# Patient Record
Sex: Male | Born: 1990 | Race: Black or African American | Hispanic: No | Marital: Single | Smoking: Never smoker
Health system: Southern US, Community
[De-identification: ages and names within clinical notes are randomized; demographics above are authoritative.]

---
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
## 2011-04-19 ENCOUNTER — Telehealth: Payer: Self-pay

## 2011-04-19 NOTE — Telephone Encounter (Signed)
Patient requesting that copies of his medical records be mailed to him.

## 2016-04-21 ENCOUNTER — Emergency Department (HOSPITAL_COMMUNITY)
Admission: EM | Admit: 2016-04-21 | Discharge: 2016-04-21 | Disposition: A | Discharge status: Home / Self Care | Payer: Self-pay | Attending: Emergency Medicine | Admitting: Emergency Medicine

## 2016-04-21 ENCOUNTER — Encounter (HOSPITAL_COMMUNITY): Payer: Self-pay | Admitting: Emergency Medicine

## 2016-04-21 DIAGNOSIS — B09 Unspecified viral infection characterized by skin and mucous membrane lesions: Secondary | ICD-10-CM | POA: Insufficient documentation

## 2016-04-21 DIAGNOSIS — R21 Rash and other nonspecific skin eruption: Secondary | ICD-10-CM

## 2016-04-21 LAB — I-STAT CHEM 8, ED
BUN: 13 mg/dL (ref 6–20)
Calcium, Ion: 1.11 mmol/L — ABNORMAL LOW (ref 1.15–1.40)
Chloride: 100 mmol/L — ABNORMAL LOW (ref 101–111)
Creatinine, Ser: 1.1 mg/dL (ref 0.61–1.24)
Glucose, Bld: 94 mg/dL (ref 65–99)
HCT: 39 % (ref 39.0–52.0)
Hemoglobin: 13.3 g/dL (ref 13.0–17.0)
Potassium: 3.6 mmol/L (ref 3.5–5.1)
Sodium: 139 mmol/L (ref 135–145)
TCO2: 24 mmol/L (ref 0–100)

## 2016-04-21 MED ORDER — ACETAMINOPHEN 160 MG/5ML PO SOLN: 15.0000 mg/kg | Freq: Once | ORAL | Status: DC

## 2016-04-21 MED ORDER — PREDNISONE 20 MG PO TABS: 60.0000 mg | ORAL_TABLET | Freq: Once | ORAL | Status: DC

## 2016-04-21 MED ORDER — IBUPROFEN 200 MG PO TABS
600.0000 mg | ORAL_TABLET | Freq: Once | ORAL | Status: AC
  Administered 2016-04-21: 600 mg via ORAL
  Filled 2016-04-21: qty 3

## 2016-04-21 MED ORDER — ACETAMINOPHEN 500 MG PO TABS
1000.0000 mg | ORAL_TABLET | Freq: Once | ORAL | Status: AC
  Administered 2016-04-21: 1000 mg via ORAL
  Filled 2016-04-21: qty 2

## 2016-04-21 MED ORDER — FAMOTIDINE 20 MG PO TABS: 20.0000 mg | ORAL_TABLET | Freq: Two times a day (BID) | ORAL | 0 refills | Status: AC

## 2016-04-21 MED ORDER — PREDNISONE 50 MG PO TABS: 50.0000 mg | ORAL_TABLET | Freq: Every day | ORAL | 0 refills | Status: AC

## 2016-04-21 NOTE — ED Notes (Signed)
Patient state he eat donut in [place] 2 days a go and noticed some swelling and rash to his feet. Feets are warm to touch with some swelling and rash upon arrival.

## 2016-04-21 NOTE — ED Triage Notes (Signed)
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

## 2016-04-21 NOTE — ED Notes (Signed)
PA alerted to temp 101.8.

## 2016-04-21 NOTE — ED Notes (Signed)
Received bedside report from Madina RN 

## 2016-04-21 NOTE — ED Provider Notes (Signed)
[facility]-EMERGENCY DEPT Provider Note   CSN: [id number] Arrival date & time: 04/21/16  1749   By signing my name below, I, Henry Johnson, attest that this documentation has been prepared under the direction and in the presence of Ebbie Ridgehris Livi Mcgann, PA-C Electronically Signed: Soijett Johnson, ED Scribe. 04/21/16. 7:57 PM.  History   Chief Complaint Chief Complaint  Patient presents with  . Allergic Reaction    HPI Henry Johnson is a 26 y.o. male who presents to the Emergency Department complaining of allergic reaction onset 2 days ago. Pt reports associated non-pruritic rash to bilateral hands, non-pruritic rash to bilateral feet, and irritation to lips. Pt hasn't tried any medications for the relief of his symptoms. 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 He denies fever, chills, and any other symptoms. Denies pertinent PMHx.    The history is provided by the patient. No language interpreter was used.    History reviewed. No pertinent past medical history.  There are no active problems to display for this patient.   History reviewed. No pertinent surgical history.     Home Medications    Prior to Admission medications   Not on File    Family History No family history on file.  Social History Social History  Substance Use Topics  . Smoking status: Not on file  . Smokeless tobacco: Not on file  . Alcohol use No     Allergies   Shrimp [shellfish allergy]   Review of Systems Review of Systems A complete 10 system review of systems was obtained and all systems are negative except as noted in the HPI and PMH.   Physical Exam Updated Vital Signs BP 129/76   Pulse 120   Temp 98.1 F (36.7 C)   Resp 16   SpO2 98%   Physical Exam  Constitutional: He is oriented to person, place, and time. He appears well-developed and well-nourished. No distress.  HENT:  Head: Normocephalic and atraumatic.  Mouth/Throat: Uvula is midline,  oropharynx is clear and moist and mucous membranes are normal.  Lips appear irritated  Eyes: EOM are normal.  Neck: Neck supple.  Cardiovascular: Normal rate.   Pulmonary/Chest: Effort normal. No respiratory distress.  Abdominal: He exhibits no distension.  Musculoskeletal: Normal range of motion.  Neurological: He is alert and oriented to person, place, and time.  Skin: Skin is warm and dry. Rash noted. There is erythema.  Fine red rash to top and bottom of bilateral feet and tops of hands. Mild redness noted to bilateral palms.  Psychiatric: He has a normal mood and affect. His behavior is normal.  Nursing note and vitals reviewed.    ED Treatments / Results  DIAGNOSTIC STUDIES: Oxygen Saturation is 98% on RA, nl by my interpretation.    COORDINATION OF CARE: 7:28 PM Discussed treatment plan with pt at bedside which includes labs, pepcid, prednisone, and pt agreed to plan.   Labs (all labs ordered are listed, but only abnormal results are displayed) Labs Reviewed  I-STAT CHEM 8, ED - Abnormal; Notable for the following:       Result Value   Chloride 100 (*)    Calcium, Ion 1.11 (*)    All other components within normal limits  RPR    Procedures Procedures (including critical care time)  Medications Ordered in ED Medications - No data to display   Initial Impression / Assessment and Plan / ED Course  I have reviewed the triage vital  signs and the nursing notes.  Pertinent labs that were available during my care of the patient were reviewed by me and considered in my medical decision making (see chart for details).     She most likely has a viral exanthem.  Due to the fact that he has fever and this rash.  I did check an RPR as well.  Patient is advised plan and all questions were answered.  Given treatment for the rash and fever.  He has no other symptoms at this time is advised to return here as needed  Final Clinical Impressions(s) / ED Diagnoses   Final  diagnoses:  None    New Prescriptions New Prescriptions   No medications on file   I personally performed the services described in this documentation, which was scribed in my presence. The recorded information has been reviewed and is accurate.    Jhoel Stieg, PA-C 04/23/16 0111    Mancel Bale, MD 04/23/16 [phone]

## 2016-04-21 NOTE — ED Provider Notes (Signed)
  Face-to-face evaluation   History: He complains of a rash for 2 days, and his lips hands and feet.  He complains of general malaise.  He is interested in finding out what caused the rash.  Physical exam: Alert, cooperative.  No respiratory distress.  Tiny red papules, lips dorsum of hands and dorsum of feet, bilaterally.  No angioedema.  MDM-rash pattern with fever most consistent with viral exanthem.  Doubt serious bacterial associated rash.  Doubt tick fever.  Doubt urticaria.  Medical screening examination/treatment/procedure(s) were conducted as a shared visit with non-physician practitioner(s) and myself.  I personally evaluated the patient during the encounter   Mancel BaleElliott Ovide Dusek, MD 04/23/16 [phone]

## 2016-04-21 NOTE — Discharge Instructions (Addendum)
Return here as needed.  Follow-up with an urgent care or primary doctor.  Tylenol and Motrin.  Take Benadryl as well for this rash

## 2016-04-22 LAB — RPR: RPR Ser Ql: NONREACTIVE

## 2019-09-14 ENCOUNTER — Emergency Department (HOSPITAL_COMMUNITY): Payer: Self-pay

## 2019-09-14 ENCOUNTER — Encounter (HOSPITAL_COMMUNITY): Payer: Self-pay

## 2019-09-14 ENCOUNTER — Emergency Department (HOSPITAL_COMMUNITY)
Admission: EM | Admit: 2019-09-14 | Discharge: 2019-09-14 | Disposition: A | Discharge status: Home / Self Care | Payer: Self-pay | Attending: Emergency Medicine | Admitting: Emergency Medicine

## 2019-09-14 ENCOUNTER — Other Ambulatory Visit: Payer: Self-pay

## 2019-09-14 DIAGNOSIS — W260XXA Contact with knife, initial encounter: Secondary | ICD-10-CM | POA: Insufficient documentation

## 2019-09-14 DIAGNOSIS — Y999 Unspecified external cause status: Secondary | ICD-10-CM | POA: Insufficient documentation

## 2019-09-14 DIAGNOSIS — S41112A Laceration without foreign body of left upper arm, initial encounter: Secondary | ICD-10-CM

## 2019-09-14 DIAGNOSIS — Y929 Unspecified place or not applicable: Secondary | ICD-10-CM | POA: Insufficient documentation

## 2019-09-14 DIAGNOSIS — Z23 Encounter for immunization: Secondary | ICD-10-CM | POA: Insufficient documentation

## 2019-09-14 DIAGNOSIS — W540XXA Bitten by dog, initial encounter: Secondary | ICD-10-CM

## 2019-09-14 DIAGNOSIS — S51812A Laceration without foreign body of left forearm, initial encounter: Secondary | ICD-10-CM | POA: Insufficient documentation

## 2019-09-14 DIAGNOSIS — Y939 Activity, unspecified: Secondary | ICD-10-CM | POA: Insufficient documentation

## 2019-09-14 DIAGNOSIS — Z79899 Other long term (current) drug therapy: Secondary | ICD-10-CM | POA: Insufficient documentation

## 2019-09-14 MED ORDER — LIDOCAINE HCL (PF) 1 % IJ SOLN
30.0000 mL | Freq: Once | INTRAMUSCULAR | Status: AC
  Administered 2019-09-14: 22:00:00 30 mL
  Filled 2019-09-14: qty 30

## 2019-09-14 MED ORDER — AMOXICILLIN-POT CLAVULANATE 875-125 MG PO TABS: 1.0000 | ORAL_TABLET | Freq: Two times a day (BID) | ORAL | 0 refills | Status: AC

## 2019-09-14 MED ORDER — TETANUS-DIPHTH-ACELL PERTUSSIS 5-2.5-18.5 LF-MCG/0.5 IM SUSP
0.5000 mL | Freq: Once | INTRAMUSCULAR | Status: AC
  Administered 2019-09-14: 22:00:00 0.5 mL via INTRAMUSCULAR
  Filled 2019-09-14: qty 0.5

## 2019-09-14 NOTE — ED Triage Notes (Signed)
Laceration to left forearm approx 4" from box cutter.

## 2019-09-14 NOTE — Discharge Instructions (Addendum)
Follow-up in 7 to 10 days for suture removal at any provider.

## 2019-09-14 NOTE — ED Provider Notes (Signed)
..Laceration Repair  Date/Time: 09/14/2019 10:27 PM Performed by: Gailen Shelter, PA Authorized by: Gailen Shelter, PA   Consent:    Consent obtained:  Verbal   Consent given by:  Patient   Risks discussed:  Infection, need for additional repair, pain, poor cosmetic result and poor wound healing   Alternatives discussed:  No treatment and delayed treatment Universal protocol:    Procedure explained and questions answered to patient or proxy's satisfaction: yes     Relevant documents present and verified: yes     Test results available and properly labeled: yes     Imaging studies available: yes     Required blood products, implants, devices, and special equipment available: yes     Site/side marked: yes     Immediately prior to procedure, a time out was called: yes     Patient identity confirmed:  Verbally with patient Anesthesia (see MAR for exact dosages):    Anesthesia method:  Local infiltration   Local anesthetic:  Lidocaine 1% w/o epi Laceration details:    Location:  Shoulder/arm   Shoulder/arm location:  L lower arm   Length (cm):  10 Repair type:    Repair type:  Intermediate Exploration:    Hemostasis achieved with:  Direct pressure   Wound extent: no foreign bodies/material noted and no tendon damage noted     Contaminated: no   Treatment:    Area cleansed with:  Saline   Amount of cleaning:  Standard   Irrigation solution:  Sterile saline   Irrigation volume:  300 mL   Irrigation method:  Pressure wash   Visualized foreign bodies/material removed: no   Skin repair:    Repair method:  Sutures   Suture size:  3-0   Suture technique:  Simple interrupted and horizontal mattress (6 simple, 1 horizontal)   Number of sutures:  7 Approximation:    Approximation:  Close Post-procedure details:    Dressing:  Antibiotic ointment and non-adherent dressing   Patient tolerance of procedure:  Tolerated well, no immediate complications .Marland KitchenLaceration  Repair  Date/Time: 09/14/2019 10:29 PM Performed by: Gailen Shelter, PA Authorized by: Gailen Shelter, PA   Consent:    Consent obtained:  Verbal   Consent given by:  Patient   Risks discussed:  Infection, need for additional repair, pain, poor cosmetic result and poor wound healing   Alternatives discussed:  No treatment and delayed treatment Universal protocol:    Procedure explained and questions answered to patient or proxy's satisfaction: yes     Relevant documents present and verified: yes     Test results available and properly labeled: yes     Imaging studies available: yes     Required blood products, implants, devices, and special equipment available: yes     Site/side marked: yes     Immediately prior to procedure, a time out was called: yes     Patient identity confirmed:  Verbally with patient Anesthesia (see MAR for exact dosages):    Anesthesia method:  Local infiltration   Local anesthetic:  Lidocaine 1% w/o epi Laceration details:    Location:  Shoulder/arm   Shoulder/arm location:  L lower arm   Length (cm):  0.5 Repair type:    Repair type:  Simple Exploration:    Hemostasis achieved with:  Direct pressure   Wound extent: no foreign bodies/material noted and no tendon damage noted     Contaminated: no   Treatment:    Area cleansed  with:  Saline   Amount of cleaning:  Standard   Irrigation solution:  Sterile saline   Irrigation method:  Pressure wash   Visualized foreign bodies/material removed: no   Skin repair:    Repair method:  Sutures   Suture size:  3-0   Suture technique:  Simple interrupted   Number of sutures:  1 Approximation:    Approximation:  Close Post-procedure details:    Dressing:  Antibiotic ointment and non-adherent dressing   Patient tolerance of procedure:  Tolerated well, no immediate complications   Two laceration repairs done by myself. Patient seen by Dr. Myrtis Ser primarily. Please see his note for full details.    Gailen Shelter, [place] 09/14/19 2231    Sabino Donovan, MD 09/14/19 2239

## 2019-09-14 NOTE — ED Provider Notes (Signed)
[facility]-EMERGENCY DEPT Provider Note   CSN: [id number] Arrival date & time: 09/14/19  1930     History Chief Complaint  Patient presents with  . Laceration    Henry Johnson is a 29 y.o. male.   Laceration Location:  Shoulder/arm Shoulder/arm laceration location:  L forearm Length:  10cm Depth:  Through dermis Quality: straight   Bleeding: venous   Injury mechanism: box cutter. Pain details:    Quality:  Aching Foreign body present:  No foreign bodies Worsened by:  Nothing Tetanus status:  Out of date Associated symptoms: no fever and no rash        History reviewed. No pertinent past medical history.  There are no problems to display for this patient.   History reviewed. No pertinent surgical history.     No family history on file.  Social History   Tobacco Use  . Smoking status: Never Smoker  . Smokeless tobacco: Never Used  Substance Use Topics  . Alcohol use: No  . Drug use: Not Currently    Home Medications Prior to Admission medications   Medication Sig Start Date End Date Taking? Authorizing Provider  amoxicillin-clavulanate (AUGMENTIN) 875-125 MG tablet Take 1 tablet by mouth 2 (two) times daily for 10 days. 09/14/19 09/24/19  Sabino Donovan, MD  famotidine (PEPCID) 20 MG tablet Take 1 tablet (20 mg total) by mouth 2 (two) times daily. 04/21/16   Lawyer, Cristal Deer, PA-C  predniSONE (DELTASONE) 50 MG tablet Take 1 tablet (50 mg total) by mouth daily. 04/21/16   Lawyer, Cristal Deer, PA-C    Allergies    Shrimp [shellfish allergy]  Review of Systems   Review of Systems  Constitutional: Negative for chills and fever.  HENT: Negative for congestion and rhinorrhea.   Respiratory: Negative for cough and shortness of breath.   Cardiovascular: Negative for chest pain and palpitations.  Gastrointestinal: Negative for diarrhea, nausea and vomiting.  Genitourinary: Negative for difficulty urinating and dysuria.    Musculoskeletal: Negative for arthralgias and back pain.  Skin: Positive for wound. Negative for color change and rash.  Neurological: Negative for light-headedness and headaches.    Physical Exam Updated Vital Signs BP (!) 142/98 (BP Location: Right Arm)   Pulse 74   Temp 98.2 F (36.8 C) (Oral)   Resp 16   Ht 6' (1.829 m)   Wt 90.7 kg   SpO2 100%   BMI 27.12 kg/m   Physical Exam Vitals and nursing note reviewed.  Constitutional:      General: He is not in acute distress.    Appearance: Normal appearance.  HENT:     Head: Normocephalic and atraumatic.     Nose: No rhinorrhea.  Eyes:     General:        Right eye: No discharge.        Left eye: No discharge.     Conjunctiva/sclera: Conjunctivae normal.  Cardiovascular:     Rate and Rhythm: Normal rate and regular rhythm.  Pulmonary:     Effort: Pulmonary effort is normal.     Breath sounds: No stridor.  Abdominal:     General: Abdomen is flat. There is no distension.     Palpations: Abdomen is soft.  Musculoskeletal:        General: No deformity or signs of injury.       Arms:  Skin:    General: Skin is warm and dry.  Neurological:     General: No focal deficit present.  Mental Status: He is alert. Mental status is at baseline.     Motor: No weakness.  Psychiatric:        Mood and Affect: Mood normal.        Behavior: Behavior normal.        Thought Content: Thought content normal.     ED Results / Procedures / Treatments   Labs (all labs ordered are listed, but only abnormal results are displayed) Labs Reviewed - No data to display  EKG None  Radiology DG Forearm Left  Result Date: 09/14/2019 CLINICAL DATA:  Laceration by box cutter EXAM: LEFT FOREARM - 2 VIEW COMPARISON:  None. FINDINGS: There is no evidence of fracture or other focal bone lesions. Soft tissues are unremarkable. IMPRESSION: No retained foreign body or osseous abnormality. Electronically Signed   By: Deatra Robinson M.D.   On:  09/14/2019 20:22    Procedures Procedures (including critical care time)  Medications Ordered in ED Medications  lidocaine (PF) (XYLOCAINE) 1 % injection 30 mL (30 mLs Infiltration Given 09/14/19 2147)  Tdap (BOOSTRIX) injection 0.5 mL (0.5 mLs Intramuscular Given 09/14/19 2221)    ED Course  I have reviewed the triage vital signs and the nursing notes.  Pertinent labs & imaging results that were available during my care of the patient were reviewed by me and considered in my medical decision making (see chart for details).    MDM Rules/Calculators/A&P                          Patient was breaking out a fight between his 2 dogs, there was also an open box cutter on the table, and he sustained a large laceration from the box cutter and has several small lacerations/puncture wounds secondary to dog bites.  The dogs are vaccinated they are his, he is not sure if his tetanus status.  We will updated today.  He will get Augmentin.  The lacerations will be repaired by PA Solon Augusta.  No imaging needed, no other injuries found reported.  No rabies testing for the animals and no vaccination today.  Patient tolerated the laceration repair well.  For the procedure note please see the PAs note.  He is discharged home with strict return precautions.   Final Clinical Impression(s) / ED Diagnoses Final diagnoses:  Arm laceration with complication, left, initial encounter  Dog bite, initial encounter    Rx / DC Orders ED Discharge Orders         Ordered    amoxicillin-clavulanate (AUGMENTIN) 875-125 MG tablet  2 times daily     Discontinue  Reprint     09/14/19 2220           Sabino Donovan, MD 09/14/19 2229

## 2019-09-24 ENCOUNTER — Ambulatory Visit
Admission: EM | Admit: 2019-09-24 | Discharge: 2019-09-24 | Disposition: A | Discharge status: Home / Self Care | Payer: Self-pay | Attending: Physician Assistant | Admitting: Physician Assistant

## 2019-09-24 ENCOUNTER — Encounter: Payer: Self-pay | Admitting: Emergency Medicine

## 2019-09-24 DIAGNOSIS — Z4802 Encounter for removal of sutures: Secondary | ICD-10-CM

## 2019-09-24 NOTE — ED Triage Notes (Signed)
Pt presents to [facility] for removal of sutures placed 10 days ago in ER.  Area appears well healed/approximated.  8 sutures total removed.  Reviewed return precautions.

## 2021-10-28 IMAGING — CR DG FOREARM 2V*L*
2 series · 2 of 2 positions shown · non-contrast
Comparison: None.

CLINICAL DATA: Laceration by box cutter

EXAM:
LEFT FOREARM - 2 VIEW

[x forearm ap left]
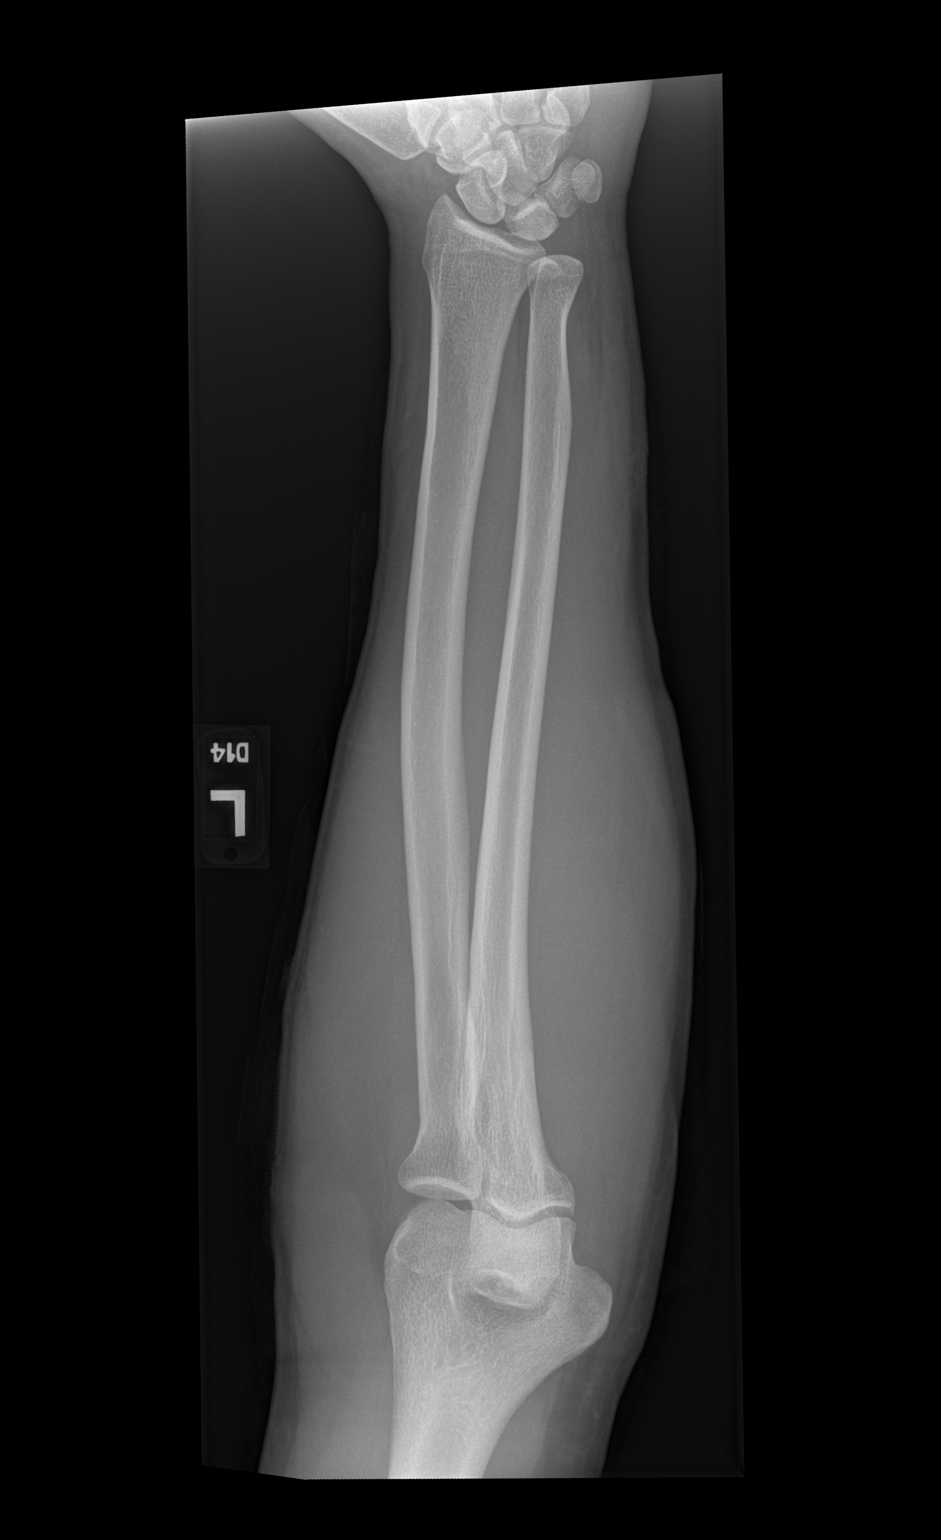

[x forearm lat left]
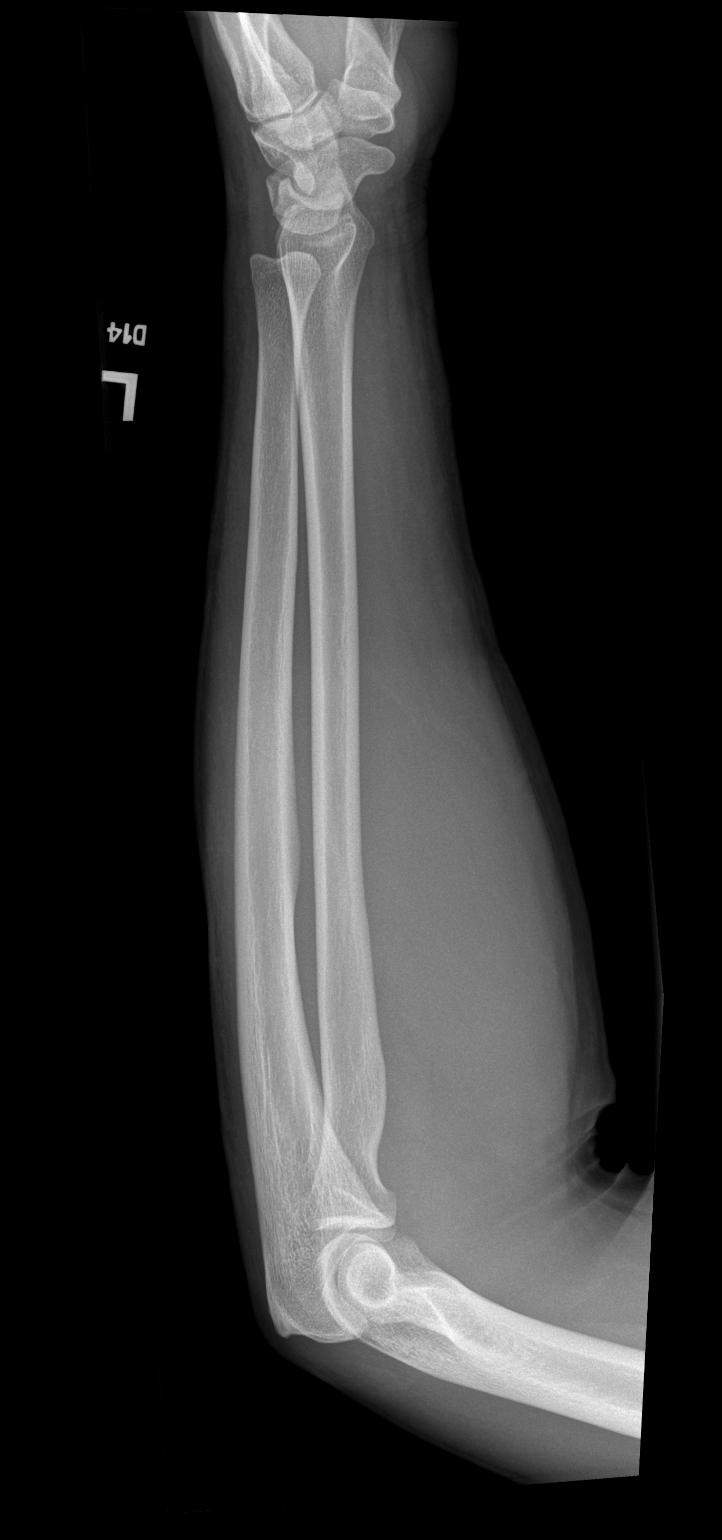

[2 of 2 positions shown; findings below may reference images not displayed]

FINDINGS: There is no evidence of fracture or other focal bone lesions. Soft
tissues are unremarkable.
IMPRESSION: No retained foreign body or osseous abnormality.

## 2023-03-07 ENCOUNTER — Ambulatory Visit: Payer: Self-pay | Attending: Obstetrics and Gynecology

## 2023-03-07 DIAGNOSIS — O285 Abnormal chromosomal and genetic finding on antenatal screening of mother: Secondary | ICD-10-CM

## 2023-03-07 DIAGNOSIS — Z31440 Encounter of male for testing for genetic disease carrier status for procreative management: Secondary | ICD-10-CM

## 2023-03-18 LAB — HORIZON CUSTOM: REPORT SUMMARY: NEGATIVE

## 2023-03-22 LAB — CHROMOSOME, BLOOD, ROUTINE
Cells Analyzed: 20
Cells Counted: 20
Cells Karyotyped: 2
GTG Band Resolution Achieved: 500

## 2023-03-22 LAB — REQUEST PROBLEM
